# Patient Record
Sex: Male | Born: 1940 | Race: Black or African American | Hispanic: No | Marital: Single | Smoking: Former smoker
Health system: Southern US, Community
[De-identification: ages and names within clinical notes are randomized; demographics above are authoritative.]

## PROBLEM LIST (undated history)

## (undated) DIAGNOSIS — R0789 Other chest pain: Secondary | ICD-10-CM

## (undated) DIAGNOSIS — H409 Unspecified glaucoma: Secondary | ICD-10-CM

## (undated) DIAGNOSIS — K219 Gastro-esophageal reflux disease without esophagitis: Secondary | ICD-10-CM

## (undated) HISTORY — PX: NO PAST SURGERIES: SHX2092

## (undated) HISTORY — DX: Other chest pain: R07.89

## (undated) HISTORY — DX: Gastro-esophageal reflux disease without esophagitis: K21.9

## (undated) HISTORY — DX: Unspecified glaucoma: H40.9

---

## 2001-08-23 ENCOUNTER — Emergency Department (HOSPITAL_COMMUNITY): Admission: EM | Admit: 2001-08-23 | Discharge: 2001-08-23 | Payer: Self-pay | Admitting: Emergency Medicine

## 2001-08-23 ENCOUNTER — Encounter: Payer: Self-pay | Admitting: Emergency Medicine

## 2004-07-17 ENCOUNTER — Ambulatory Visit (HOSPITAL_COMMUNITY): Admission: RE | Admit: 2004-07-17 | Discharge: 2004-07-17 | Payer: Self-pay | Admitting: Gastroenterology

## 2014-05-10 DIAGNOSIS — H52223 Regular astigmatism, bilateral: Secondary | ICD-10-CM | POA: Diagnosis not present

## 2014-05-10 DIAGNOSIS — H524 Presbyopia: Secondary | ICD-10-CM | POA: Diagnosis not present

## 2014-05-10 DIAGNOSIS — H5203 Hypermetropia, bilateral: Secondary | ICD-10-CM | POA: Diagnosis not present

## 2014-05-10 DIAGNOSIS — H40013 Open angle with borderline findings, low risk, bilateral: Secondary | ICD-10-CM | POA: Diagnosis not present

## 2014-05-10 DIAGNOSIS — H2513 Age-related nuclear cataract, bilateral: Secondary | ICD-10-CM | POA: Diagnosis not present

## 2014-06-07 DIAGNOSIS — H40023 Open angle with borderline findings, high risk, bilateral: Secondary | ICD-10-CM | POA: Diagnosis not present

## 2014-06-27 DIAGNOSIS — H4011X1 Primary open-angle glaucoma, mild stage: Secondary | ICD-10-CM | POA: Diagnosis not present

## 2014-08-16 DIAGNOSIS — H4011X1 Primary open-angle glaucoma, mild stage: Secondary | ICD-10-CM | POA: Diagnosis not present

## 2014-10-10 DIAGNOSIS — D120 Benign neoplasm of cecum: Secondary | ICD-10-CM | POA: Diagnosis not present

## 2014-10-10 DIAGNOSIS — Z1211 Encounter for screening for malignant neoplasm of colon: Secondary | ICD-10-CM | POA: Diagnosis not present

## 2014-10-10 DIAGNOSIS — K5730 Diverticulosis of large intestine without perforation or abscess without bleeding: Secondary | ICD-10-CM | POA: Diagnosis not present

## 2014-10-16 DIAGNOSIS — Z0000 Encounter for general adult medical examination without abnormal findings: Secondary | ICD-10-CM | POA: Diagnosis not present

## 2014-10-16 DIAGNOSIS — Z23 Encounter for immunization: Secondary | ICD-10-CM | POA: Diagnosis not present

## 2014-10-16 DIAGNOSIS — L723 Sebaceous cyst: Secondary | ICD-10-CM | POA: Diagnosis not present

## 2014-10-16 DIAGNOSIS — E780 Pure hypercholesterolemia: Secondary | ICD-10-CM | POA: Diagnosis not present

## 2014-10-16 DIAGNOSIS — I868 Varicose veins of other specified sites: Secondary | ICD-10-CM | POA: Diagnosis not present

## 2016-03-03 ENCOUNTER — Encounter (HOSPITAL_COMMUNITY): Payer: Self-pay | Admitting: Emergency Medicine

## 2016-03-03 ENCOUNTER — Emergency Department (HOSPITAL_COMMUNITY)
Admission: EM | Admit: 2016-03-03 | Discharge: 2016-03-03 | Disposition: A | Discharge status: Home / Self Care | Payer: Medicare Other | Attending: Emergency Medicine | Admitting: Emergency Medicine

## 2016-03-03 ENCOUNTER — Emergency Department (HOSPITAL_COMMUNITY): Payer: Medicare Other

## 2016-03-03 DIAGNOSIS — K219 Gastro-esophageal reflux disease without esophagitis: Secondary | ICD-10-CM | POA: Insufficient documentation

## 2016-03-03 DIAGNOSIS — R0789 Other chest pain: Secondary | ICD-10-CM

## 2016-03-03 DIAGNOSIS — R079 Chest pain, unspecified: Secondary | ICD-10-CM | POA: Diagnosis present

## 2016-03-03 DIAGNOSIS — Z79899 Other long term (current) drug therapy: Secondary | ICD-10-CM | POA: Insufficient documentation

## 2016-03-03 LAB — BASIC METABOLIC PANEL
ANION GAP: 6 (ref 5–15)
BUN: 25 mg/dL — AB (ref 6–20)
CALCIUM: 9.6 mg/dL (ref 8.9–10.3)
CO2: 26 mmol/L (ref 22–32)
CREATININE: 1.56 mg/dL — AB (ref 0.61–1.24)
Chloride: 105 mmol/L (ref 101–111)
GFR calc Af Amer: 49 mL/min — ABNORMAL LOW (ref >60)
GFR, EST NON AFRICAN AMERICAN: 42 mL/min — AB (ref >60)
GLUCOSE: 111 mg/dL — AB (ref 65–99)
Potassium: 4.2 mmol/L (ref 3.5–5.1)
Sodium: 137 mmol/L (ref 135–145)

## 2016-03-03 LAB — I-STAT TROPONIN, ED: TROPONIN I, POC: 0 ng/mL (ref 0.00–0.08)

## 2016-03-03 LAB — CBC
HCT: 41.9 % (ref 39.0–52.0)
HEMOGLOBIN: 14.4 g/dL (ref 13.0–17.0)
MCH: 31.9 pg (ref 26.0–34.0)
MCHC: 34.4 g/dL (ref 30.0–36.0)
MCV: 92.9 fL (ref 78.0–100.0)
PLATELETS: 175 10*3/uL (ref 150–400)
RBC: 4.51 MIL/uL (ref 4.22–5.81)
RDW: 13.3 % (ref 11.5–15.5)
WBC: 9.9 10*3/uL (ref 4.0–10.5)

## 2016-03-03 LAB — D-DIMER, QUANTITATIVE: D-Dimer, Quant: 0.55 ug/mL-FEU — ABNORMAL HIGH (ref 0.00–0.50)

## 2016-03-03 MED ORDER — GI COCKTAIL ~~LOC~~
30.0000 mL | Freq: Once | ORAL | Status: AC
  Administered 2016-03-03: 30 mL via ORAL
  Filled 2016-03-03: qty 30

## 2016-03-03 MED ORDER — OMEPRAZOLE 20 MG PO CPDR: 20.0000 mg | DELAYED_RELEASE_CAPSULE | Freq: Every day | ORAL | 0 refills | Status: AC

## 2016-03-03 NOTE — ED Triage Notes (Signed)
Pt reports having chest pain that started at 1900 on 03/02/16 and pt was recently traveling via plane for the last 2 hr. Pt states it is difficult to take a deep breath. Pt stated pain feels like trapped gas.

## 2016-03-03 NOTE — ED Provider Notes (Signed)
[facility]-EMERGENCY DEPT Provider Note   CSN: [id number] Arrival date & time: 03/03/16  0142 By signing my name below, I, Rickey Cunningham, attest that this documentation has been prepared under the direction and in the presence of Shon Batonourtney F Anaia Frith, MD . Electronically Signed: Levon HedgerElizabeth Cunningham, Scribe. 03/03/2016. 2:45 AM.   History   Chief Complaint Chief Complaint  Patient presents with  . Chest Pain  . Shortness of Breath   HPI Rickey Cunningham is a 75 y.o. male with a hx of GERD who presents to the Emergency Department complaining of mild, dull chest pain that radiates to his left arm and back onset last night at 7 pm. He states it feels "like trapped gas"; he rates this pain as 3/10. Pt flew from [place] to [place] today and then [place] [facility] [facility] [place]. Per pt, this pain began tonight after eating pizza at the airport in [place]. Per pt, this feels similar to prior episodes of GERD. Pt states he is currently enrolled in a cardiac study at [facility], but denies any hx of heart disease, DM, HTN, HLD, or blood clots. Pt denies any SOB or new leg swelling.   The history is provided by the patient. No language interpreter was used.   No past medical history on file.  There are no active problems to display for this patient.   No past surgical history on file.   Home Medications    Prior to Admission medications   Medication Sig Start Date End Date Taking? Authorizing Provider  Investigational - Study Medication Take 1 tablet by mouth daily. 02/09/16 05/31/16 Yes Historical Provider, MD  latanoprost (XALATAN) 0.005 % ophthalmic solution 1 drop at bedtime. 01/19/16  Yes Historical Provider, MD  omeprazole (PRILOSEC) 20 MG capsule Take 1 capsule (20 mg total) by mouth daily. 03/03/16   Shon Batonourtney F Greydis Stlouis, MD    Family History No family history on file.  Social History Social History  Substance Use Topics  . Smoking status: Never Smoker  . Smokeless tobacco: Never Used  .  Alcohol use No     Allergies   Patient has no known allergies.   Review of Systems Review of Systems  Constitutional: Negative for fever.  Respiratory: Negative for cough and shortness of breath.   Cardiovascular: Positive for chest pain. Negative for leg swelling.  Gastrointestinal: Negative for abdominal pain, nausea and vomiting.  All other systems reviewed and are negative.  Physical Exam Updated Vital Signs BP 129/68   Pulse 65   Temp 97.8 F (36.6 C) (Oral)   Resp 19   Ht 6\' 1"  (1.854 m)   Wt 209 lb (94.8 kg)   SpO2 100%   BMI 27.57 kg/m   Physical Exam  Constitutional: He is oriented to person, place, and time. He appears well-developed and well-nourished.  Appears younger than stated age  HENT:  Head: Normocephalic and atraumatic.  Cardiovascular: Normal rate, regular rhythm and normal heart sounds.   No murmur heard. Pulmonary/Chest: Effort normal and breath sounds normal. No respiratory distress. He has no wheezes.  Abdominal: Soft. Bowel sounds are normal. There is no tenderness. There is no rebound.  Musculoskeletal: He exhibits no edema.  Neurological: He is alert and oriented to person, place, and time.  Skin: Skin is warm and dry.  Psychiatric: He has a normal mood and affect.  Nursing note and vitals reviewed.   ED Treatments / Results  DIAGNOSTIC STUDIES:  Oxygen Saturation is 97% on RA, nl by my interpretation.  COORDINATION OF CARE:  2:38 AM Discussed treatment plan with pt at bedside and pt agreed to plan.  Labs (all labs ordered are listed, but only abnormal results are displayed) Labs Reviewed  BASIC METABOLIC PANEL - Abnormal; Notable for the following:       Result Value   Glucose, Bld 111 (*)    BUN 25 (*)    Creatinine, Ser 1.56 (*)    GFR calc non Af Amer 42 (*)    GFR calc Af Amer 49 (*)    All other components within normal limits  D-DIMER, QUANTITATIVE (NOT AT [facility]) - Abnormal; Notable for the following:    D-Dimer,  Quant 0.55 (*)    All other components within normal limits  CBC  I-STAT TROPOININ, ED    EKG  EKG Interpretation  Date/Time:  Monday March 03 2016 01:46:30 EST Ventricular Rate:  64 PR Interval:    QRS Duration: 90 QT Interval:  387 QTC Calculation: 400 R Axis:   28 Text Interpretation:  Sinus rhythm Consider left atrial enlargement Confirmed by Wilkie AyeHORTON  MD, Josia Cueva ([id number]) on 03/03/2016 4:06:53 AM       Radiology Dg Chest 2 View  Result Date: 03/03/2016 CLINICAL DATA:  Chest pain starting at 1700 hours on 02 March 2016. EXAM: CHEST  2 VIEW COMPARISON:  Report from 08/23/2001 of the right ribs and shoulder. FINDINGS: The heart size and mediastinal contours are within normal limits. Both lungs are clear. No acute osseous abnormality. IMPRESSION: No active cardiopulmonary disease. Electronically Signed   By: Tollie Ethavid  Kwon M.D.   On: 03/03/2016 02:49    Procedures Procedures (including critical care time)  Medications Ordered in ED Medications  gi cocktail (Maalox,Lidocaine,Donnatal) (30 mLs Oral Given 03/03/16 0311)     Initial Impression / Assessment and Plan / ED Course  I have reviewed the triage vital signs and the nursing notes.  Pertinent labs & imaging results that were available during my care of the patient were reviewed by me and considered in my medical decision making (see chart for details).  Clinical Course     Patient presents with chest pain. Atypical for ACS. Heart score of 2. EKG is nonischemic and initial troponin is negative. Reports the pain started with eating and is similar to prior reflux symptoms. Pain improved with a GI cocktail. Given extensive travel, d-dimer was sent. Per age adjusted cutoffs, this is negative. On recheck, patient reports that he feels much improved. He is no longer in pain. Low suspicion for ACS at this time. We'll refer to cardiology. He also has follow-up for the cardiology study he is participating in.  After history,  exam, and medical workup I feel the patient has been appropriately medically screened and is safe for discharge home. Pertinent diagnoses were discussed with the patient. Patient was given return precautions.   Final Clinical Impressions(s) / ED Diagnoses   Final diagnoses:  Atypical chest pain  Gastroesophageal reflux disease, esophagitis presence not specified    New Prescriptions New Prescriptions   OMEPRAZOLE (PRILOSEC) 20 MG CAPSULE    Take 1 capsule (20 mg total) by mouth daily.   I personally performed the services described in this documentation, which was scribed in my presence. The recorded information has been reviewed and is accurate.    Shon Batonourtney F Abagail Limb, MD 03/03/16 [phone]

## 2016-03-03 NOTE — ED Notes (Signed)
Patient transported to X-ray 

## 2016-03-03 NOTE — Discharge Instructions (Signed)
If you have worsening CP, SOB, or any new or worsening symptoms, you need to be re-evaluated.

## 2016-03-13 ENCOUNTER — Encounter: Payer: Self-pay | Admitting: *Deleted

## 2016-04-04 ENCOUNTER — Encounter (INDEPENDENT_AMBULATORY_CARE_PROVIDER_SITE_OTHER): Payer: Self-pay

## 2016-04-04 ENCOUNTER — Ambulatory Visit (INDEPENDENT_AMBULATORY_CARE_PROVIDER_SITE_OTHER): Payer: Medicare Other | Admitting: Internal Medicine

## 2016-04-04 ENCOUNTER — Encounter: Payer: Self-pay | Admitting: Internal Medicine

## 2016-04-04 VITALS — BP 142/68 | HR 66 | Ht 73.0 in | Wt 212.2 lb

## 2016-04-04 DIAGNOSIS — R079 Chest pain, unspecified: Secondary | ICD-10-CM | POA: Diagnosis not present

## 2016-04-04 DIAGNOSIS — N289 Disorder of kidney and ureter, unspecified: Secondary | ICD-10-CM | POA: Diagnosis not present

## 2016-04-04 DIAGNOSIS — R030 Elevated blood-pressure reading, without diagnosis of hypertension: Secondary | ICD-10-CM

## 2016-04-04 NOTE — Progress Notes (Signed)
New Outpatient Visit Date: 04/04/2016  Primary Care Provider: L.Lupe Carney, MD [address] [place], [place] [address]  Chief Complaint: Chest pain  HPI:  Rickey Cunningham is a 76 y.o. year-old male with history of GERD and glaucoma, who has been referred by Dr. Ross Marcus of the [facility] ED for evaluation of chest pain. The patient presented to the ED on 03/03/16 with chest pain radiating to the left arm and back. ED workup was notable for normal EKG, negative troponins 1, and borderline elevated d-dimer. The reports that his chest pain began shortly after eating a piece of pizza at [place] airport. He had a sharp pain extending from the sternum under his left breast without radiation that lasted 2-3 days. The pain was 7-8/10 in intensity without accompanying symptoms such as dyspnea, nausea, diaphoresis, and lightheadedness. He was given an antacid after evaluation in the emergency department with resolution of the pain. He has not had recurrence since. He is typically quite active and exercises regularly, predominantly doing strength training exercises. He does not have any exertional symptoms. He denies a history of prior cardiovascular disease. His only previous cardiac workup has been an EKG.  Rickey Cunningham denies palpitations and lightheadedness. He notes occasional leg swelling when seated for extended periods of time, though this resolves with leg elevation and ambulation. He had his annual physical with Dr. Clovis Riley about 6 months ago and was told that everything looked fine. The patient denies a history of hypertension and kidney disease. He believes lipids were checked at the time of his last physical. He is also enrolled N/A cardiovascular health study through [facility].  --------------------------------------------------------------------------------------------------  Cardiovascular History & Procedures: Cardiovascular Problems:  Chest pain  Risk  Factors:  Male gender and age greater than 64  Cath/PCI:  None  CV Surgery:  None  EP Procedures and Devices:  None  Non-Invasive Evaluation(s):  None  Recent CV Pertinent Labs: Lab Results  Component Value Date   K 4.2 03/03/2016   BUN 25 (H) 03/03/2016   CREATININE 1.56 (H) 03/03/2016    --------------------------------------------------------------------------------------------------  Past Medical History:  Diagnosis Date  . Atypical chest pain   . GERD (gastroesophageal reflux disease)   . GERD (gastroesophageal reflux disease)   . Glaucoma     Past Surgical History:  Procedure Laterality Date  . NO PAST SURGERIES      Outpatient Encounter Prescriptions as of 04/04/2016  Medication Sig  . Investigational - Study Medication Take 1 tablet by mouth daily.  Marland Kitchen latanoprost (XALATAN) 0.005 % ophthalmic solution Place 1 drop into both eyes at bedtime.   Marland Kitchen omeprazole (PRILOSEC) 20 MG capsule Take 1 capsule (20 mg total) by mouth daily.   No facility-administered encounter medications on file as of 04/04/2016.     Allergies: Patient has no known allergies.  Social History   Social History  . Marital status: Single    Spouse name: N/A  . Number of children: N/A  . Years of education: N/A   Occupational History  . Not on file.   Social History Main Topics  . Smoking status: Former Smoker    Packs/day: 0.10    Years: 10.00    Quit date: 21  . Smokeless tobacco: Never Used  . Alcohol use 0.6 oz/week    1 Glasses of wine per week  . Drug use: No  . Sexual activity: Not on file   Other Topics Concern  . Not on file   Social  History Narrative  . No narrative on file    Family History  Problem Relation Age of Onset  . Diabetes Mother   . Kidney disease Father   . Diabetes Father   . Diabetes Brother   . Diabetes Brother     Review of Systems: A 12-system review of systems was performed and was negative except as noted in the  HPI.  --------------------------------------------------------------------------------------------------  Physical Exam: BP (!) 160/68   Pulse 66   Ht 6\' 1"  (1.854 m)   Wt 212 lb 3.2 oz (96.3 kg)   BMI 28.00 kg/m  Repeat BP: 142/68  General:  Well-developed, well-nourished man, seated comfortably in the exam room. HEENT: No conjunctival pallor or scleral icterus.  Moist mucous membranes.  OP clear. Neck: Supple without lymphadenopathy, thyromegaly, JVD, or HJR.  No carotid bruit. Lungs: Normal work of breathing.  Clear to auscultation bilaterally without wheezes or crackles. Heart: Regular rate and rhythm without murmurs, rubs, or gallops.  Non-displaced PMI. Abd: Bowel sounds present.  Soft, NT/ND without hepatosplenomegaly Ext: No lower extremity edema.  Radial, PT, and DP pulses are 2+ bilaterally Skin: warm and dry without rash Neuro: CNIII-XII intact.  Strength and fine-touch sensation intact in upper and lower extremities bilaterally. Psych: Normal mood and affect.  EKG:  Sinus bradycardia (rate 56 bpm) without significant abnormalities.  Heart rate has decreased slightly since prior tracing on 03/03/16.  Otherwise, there has been no significant interval change (I have personally reviewed both tracings).  Lab Results  Component Value Date   WBC 9.9 03/03/2016   HGB 14.4 03/03/2016   HCT 41.9 03/03/2016   MCV 92.9 03/03/2016   PLT 175 03/03/2016    Lab Results  Component Value Date   NA 137 03/03/2016   K 4.2 03/03/2016   CL 105 03/03/2016   CO2 26 03/03/2016   BUN 25 (H) 03/03/2016   CREATININE 1.56 (H) 03/03/2016   GLUCOSE 111 (H) 03/03/2016    --------------------------------------------------------------------------------------------------  ASSESSMENT AND PLAN: Noncardiac chest pain The patient's description of pain is consistent with noncardiac chest pain. He is typically quite active and has not had any exertional symptoms. Given that the discomfort began  after eating a piece of pizza and lasted several days, ultimately resolving with an antacid, I favor a GI etiology. He currently takes omeprazole only as needed. I recommended that he begin taking a PPI regularly if he has recurrence of the pain. If he develops new chest pain or shortness of breath, he should contact us for further evaluation. I will defer ongoing primary prevention to Dr. Clovis RileyMitchell.  Renal insufficiency Labs during recent ED visit were notable for creatinine of 1.6. There is no prior value for comparison. The patient denies a history of kidney disease. We will recheck his creatinine today. If it remains elevated, the patient will need to follow with his PCP for further evaluation.  Elevated blood pressure Blood pressure mildly to moderately elevated today. Patient notes that he has had some elevated readings in the past, though these are typically normal on recheck. We discussed lifestyle modifications, including cardiovascular exercise and avoidance of salt. The patient should follow-up with his PCP as previously directed.  Follow-up: Return to clinic as needed.  Yvonne Kendallhristopher Glennis Borger, MD 04/04/2016 2:11 PM

## 2016-04-04 NOTE — Patient Instructions (Signed)
Medication Instructions:  Your physician recommends that you continue on your current medications as directed. Please refer to the Current Medication list given to you today.   Labwork: BMET today  Testing/Procedures: None   Follow-Up: Your physician recommends that you schedule a follow-up appointment as needed with Dr End.         If you need a refill on your cardiac medications before your next appointment, please call your pharmacy.

## 2016-04-05 LAB — BASIC METABOLIC PANEL
BUN/Creatinine Ratio: 17 (ref 10–24)
BUN: 26 mg/dL (ref 8–27)
CALCIUM: 9.3 mg/dL (ref 8.6–10.2)
CHLORIDE: 98 mmol/L (ref 96–106)
CO2: 24 mmol/L (ref 18–29)
CREATININE: 1.49 mg/dL — AB (ref 0.76–1.27)
GFR calc non Af Amer: 46 mL/min/{1.73_m2} — ABNORMAL LOW (ref >59)
GFR, EST AFRICAN AMERICAN: 53 mL/min/{1.73_m2} — AB (ref >59)
Glucose: 103 mg/dL — ABNORMAL HIGH (ref 65–99)
Potassium: 4.1 mmol/L (ref 3.5–5.2)
Sodium: 140 mmol/L (ref 134–144)

## 2016-04-11 ENCOUNTER — Encounter: Payer: Self-pay | Admitting: *Deleted

## 2016-04-17 ENCOUNTER — Telehealth: Payer: Self-pay | Admitting: Internal Medicine

## 2016-04-17 NOTE — Telephone Encounter (Signed)
I spoke with patient about lab done 04/04/16.

## 2016-04-17 NOTE — Telephone Encounter (Signed)
Mr. Rickey Cunningham is calling to get his test results . Please call   Thanks

## 2018-10-24 IMAGING — CR DG CHEST 2V
2 series · 2 of 2 positions shown · non-contrast
Comparison: Report from 08/23/2001 of the right ribs and shoulder.

CLINICAL DATA: Chest pain starting at 5055 hours on 02 March, 2016.

EXAM:
CHEST  2 VIEW

[w chest pa]
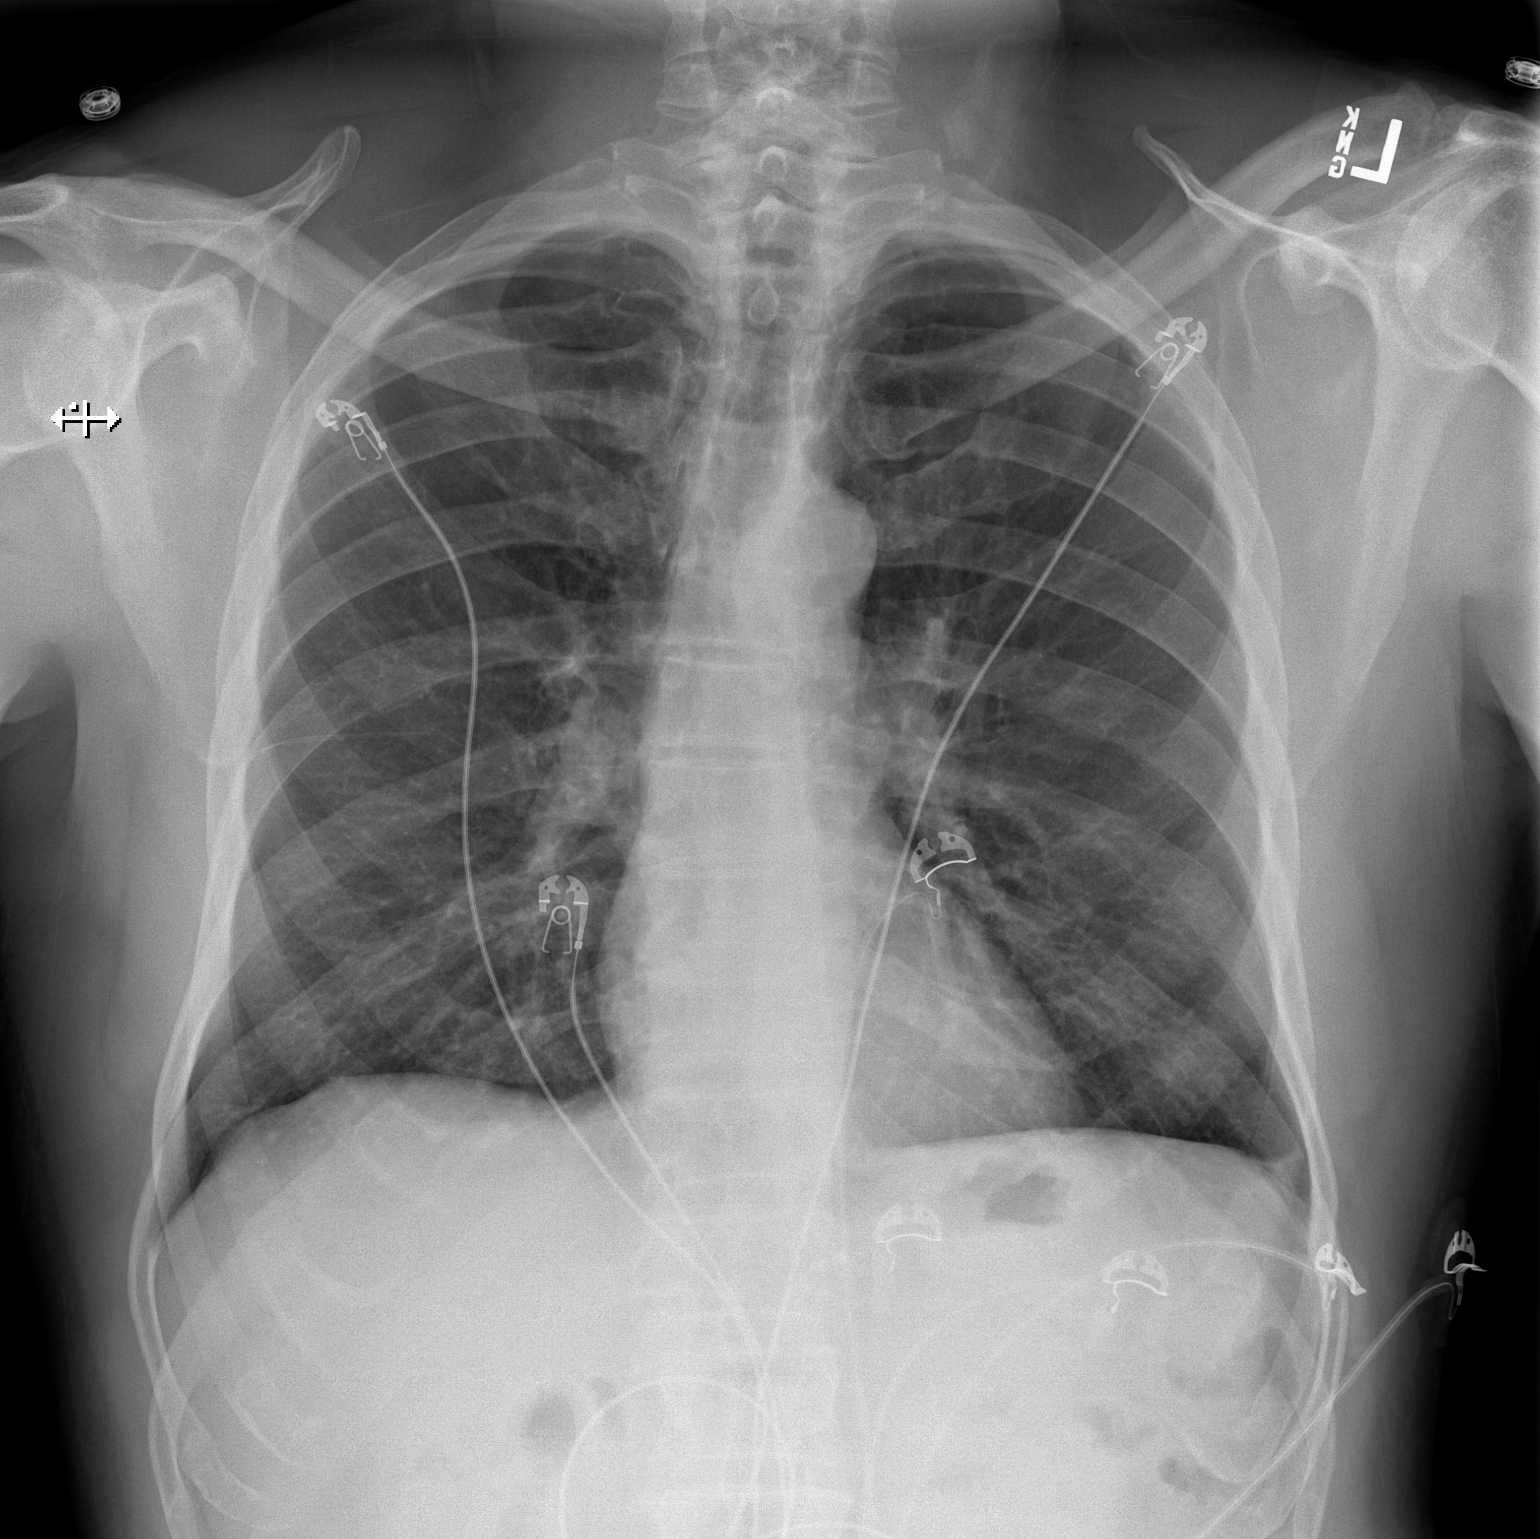

[w chest lat]
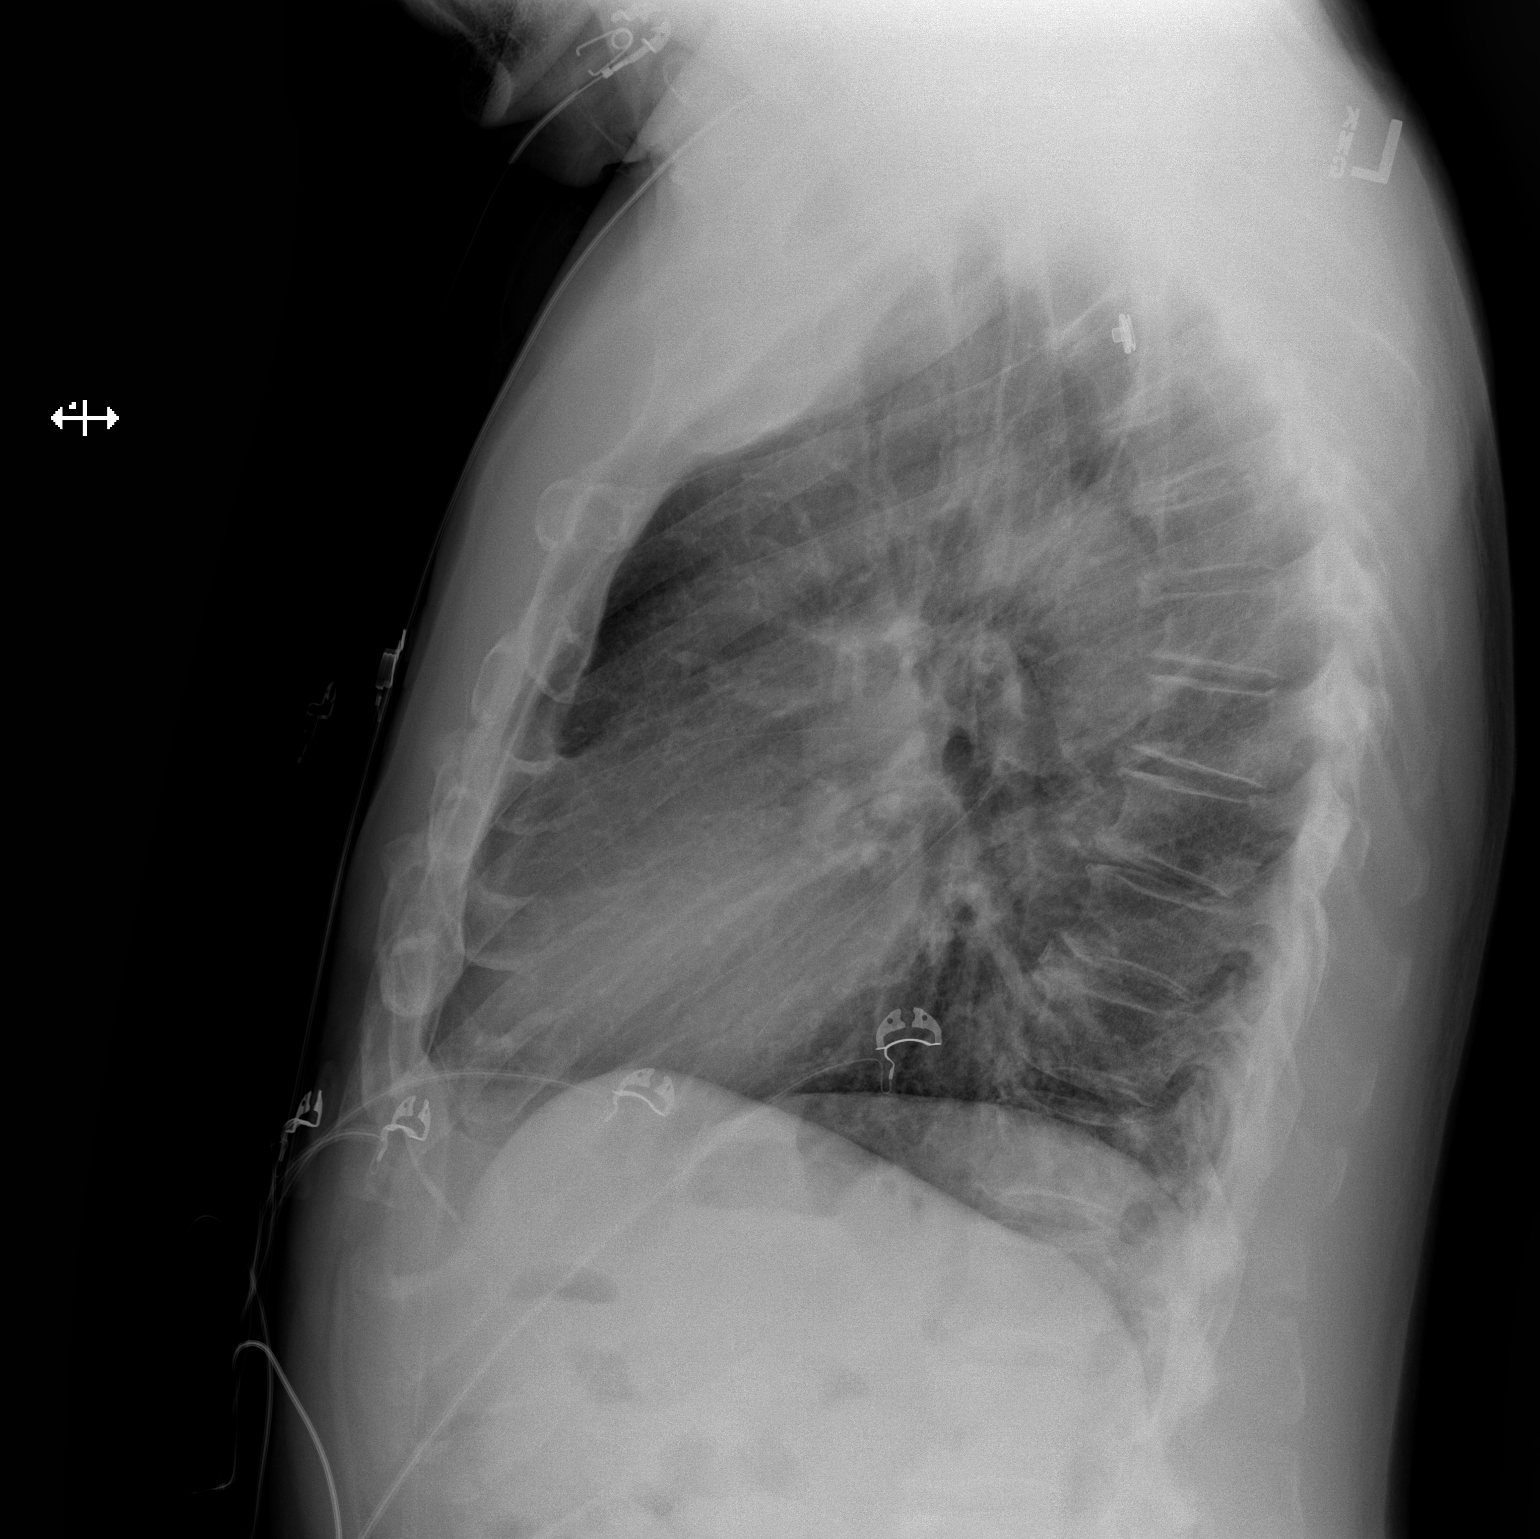

[2 of 2 positions shown; findings below may reference images not displayed]

FINDINGS: The heart size and mediastinal contours are within normal limits.
Both lungs are clear. No acute osseous abnormality.
IMPRESSION: No active cardiopulmonary disease.

## 2020-02-23 DIAGNOSIS — H401111 Primary open-angle glaucoma, right eye, mild stage: Secondary | ICD-10-CM | POA: Diagnosis not present

## 2020-03-30 DIAGNOSIS — Z1152 Encounter for screening for COVID-19: Secondary | ICD-10-CM | POA: Diagnosis not present

## 2020-04-04 DIAGNOSIS — K621 Rectal polyp: Secondary | ICD-10-CM | POA: Diagnosis not present

## 2020-04-04 DIAGNOSIS — Z86010 Personal history of colonic polyps: Secondary | ICD-10-CM | POA: Diagnosis not present

## 2020-04-06 DIAGNOSIS — K621 Rectal polyp: Secondary | ICD-10-CM | POA: Diagnosis not present

## 2020-12-11 DIAGNOSIS — N183 Chronic kidney disease, stage 3 unspecified: Secondary | ICD-10-CM | POA: Diagnosis not present

## 2020-12-11 DIAGNOSIS — M25512 Pain in left shoulder: Secondary | ICD-10-CM | POA: Diagnosis not present

## 2020-12-11 DIAGNOSIS — Z0000 Encounter for general adult medical examination without abnormal findings: Secondary | ICD-10-CM | POA: Diagnosis not present

## 2020-12-11 DIAGNOSIS — E7800 Pure hypercholesterolemia, unspecified: Secondary | ICD-10-CM | POA: Diagnosis not present

## 2020-12-11 DIAGNOSIS — Z23 Encounter for immunization: Secondary | ICD-10-CM | POA: Diagnosis not present

## 2020-12-11 DIAGNOSIS — I868 Varicose veins of other specified sites: Secondary | ICD-10-CM | POA: Diagnosis not present

## 2021-03-04 DIAGNOSIS — H16223 Keratoconjunctivitis sicca, not specified as Sjogren's, bilateral: Secondary | ICD-10-CM | POA: Diagnosis not present

## 2021-03-04 DIAGNOSIS — H40002 Preglaucoma, unspecified, left eye: Secondary | ICD-10-CM | POA: Diagnosis not present

## 2021-03-04 DIAGNOSIS — H401111 Primary open-angle glaucoma, right eye, mild stage: Secondary | ICD-10-CM | POA: Diagnosis not present

## 2021-05-10 DIAGNOSIS — M19012 Primary osteoarthritis, left shoulder: Secondary | ICD-10-CM | POA: Diagnosis not present

## 2021-06-24 DIAGNOSIS — M19012 Primary osteoarthritis, left shoulder: Secondary | ICD-10-CM | POA: Diagnosis not present

## 2021-08-20 DIAGNOSIS — K090 Developmental odontogenic cysts: Secondary | ICD-10-CM | POA: Diagnosis not present

## 2021-08-27 DIAGNOSIS — L723 Sebaceous cyst: Secondary | ICD-10-CM | POA: Diagnosis not present

## 2021-10-10 DIAGNOSIS — L723 Sebaceous cyst: Secondary | ICD-10-CM | POA: Diagnosis not present

## 2021-11-11 DIAGNOSIS — L723 Sebaceous cyst: Secondary | ICD-10-CM | POA: Diagnosis not present

## 2021-11-11 DIAGNOSIS — L089 Local infection of the skin and subcutaneous tissue, unspecified: Secondary | ICD-10-CM | POA: Diagnosis not present

## 2021-11-12 ENCOUNTER — Ambulatory Visit: Payer: Self-pay | Admitting: Surgery

## 2021-11-12 NOTE — H&P (Signed)
Subjective    Chief Complaint: New Consultation (Cyst on back )       History of Present Illness: Rickey Cunningham is a 81 y.o. male who is seen today as an office consultation at the request of Dr. Lewie Chamber for evaluation of New Consultation (Cyst on back ) .     This is a 81 year old male in excellent health who exercises vigorously.  He had a small cyst on the middle of his back for some time.  However in June of this year, it became larger and inflamed.  He underwent incision and drainage by his primary care physician.  The wound has never healed completely and continues to have small amounts of drainage.  The patient is referred to [place] to discuss formal excision of the cyst to allow complete wound healing.     Review of Systems: A complete review of systems was obtained from the patient.  I have reviewed this information and discussed as appropriate with the patient.  See HPI as well for other ROS.   Review of Systems  Constitutional: Negative.   HENT: Negative.    Eyes: Negative.   Respiratory: Negative.    Cardiovascular: Negative.   Gastrointestinal: Negative.   Genitourinary: Negative.   Musculoskeletal: Negative.   Skin: Negative.   Neurological: Negative.   Endo/Heme/Allergies: Negative.   Psychiatric/Behavioral: Negative.         Medical History: Past Medical History      Past Medical History:  Diagnosis Date   GERD (gastroesophageal reflux disease)             Patient Active Problem List  Diagnosis   Chronic kidney disease, stage 3 unspecified (CMS-HCC)   Gastroesophageal reflux disease   Pure hypercholesterolemia   Sebaceous cyst      Past Surgical History  History reviewed. No pertinent surgical history.      Allergies  No Known Allergies     No current outpatient medications on file prior to visit.    No current facility-administered medications on file prior to visit.      Family History       Family History  Problem Relation Age of Onset    High blood pressure (Hypertension) Mother     Breast cancer Brother          Social History        Tobacco Use  Smoking Status Former   Types: Cigarettes  Smokeless Tobacco Not on file      Social History  Social History         Socioeconomic History   Marital status: Divorced  Tobacco Use   Smoking status: Former      Types: Cigarettes  Substance and Sexual Activity   Alcohol use: Not Currently   Drug use: Never        Objective:         Vitals:    11/11/21 0938  BP: 114/66  Pulse: 54  Temp: 36.4 C (97.6 F)  SpO2: 96%  Weight: 87.7 kg (193 lb 6.4 oz)  Height: 185.4 cm (6\' 1" )    Body mass index is 25.52 kg/m.   Physical Exam    Constitutional:  WDWN in NAD, conversant, no obvious deformities; lying in bed comfortably Eyes:  Pupils equal, round; sclera anicteric; moist conjunctiva; no lid lag HENT:  Oral mucosa moist; good dentition  Neck:  No masses palpated, trachea midline; no thyromegaly Lungs:  CTA bilaterally; normal respiratory effort CV:  Regular rate and rhythm;  no murmurs; extremities well-perfused with no edema Abd:  +bowel sounds, soft, non-tender, no palpable organomegaly; no palpable hernias Musc:  Unable to assess gait; no apparent clubbing or cyanosis in extremities Lymphatic:  No palpable cervical or axillary lymphadenopathy Skin:  Warm, dry; in the center of his back just to the right of the spine there is a 2 cm area of chronic discoloration of the skin.  In the center of this, there is a pinpoint opening with some whitish drainage.  No cellulitis. Psychiatric - alert and oriented x 4; calm mood and affect       Assessment and Plan:  Diagnoses and all orders for this visit:   Infected sebaceous cyst of skin       Chronic sebaceous cyst requires formal excision to completely remove the wall to allow proper healing.   Recommend excision of sebaceous cyst of the back.The surgical procedure has been discussed with the patient.   Potential risks, benefits, alternative treatments, and expected outcomes have been explained.  All of the patient's questions at this time have been answered.  The likelihood of reaching the patient's treatment goal is good.  The patient understand the proposed surgical procedure and wishes to proceed.         Kamylle Axelson Delbert Harness, MD  11/12/2021 9:52 AM

## 2021-11-27 DIAGNOSIS — L723 Sebaceous cyst: Secondary | ICD-10-CM | POA: Diagnosis not present

## 2021-11-27 DIAGNOSIS — L720 Epidermal cyst: Secondary | ICD-10-CM | POA: Diagnosis not present

## 2021-12-23 DIAGNOSIS — R609 Edema, unspecified: Secondary | ICD-10-CM | POA: Diagnosis not present

## 2021-12-23 DIAGNOSIS — Z23 Encounter for immunization: Secondary | ICD-10-CM | POA: Diagnosis not present

## 2021-12-23 DIAGNOSIS — E7800 Pure hypercholesterolemia, unspecified: Secondary | ICD-10-CM | POA: Diagnosis not present

## 2021-12-23 DIAGNOSIS — N183 Chronic kidney disease, stage 3 unspecified: Secondary | ICD-10-CM | POA: Diagnosis not present

## 2021-12-23 DIAGNOSIS — Z0000 Encounter for general adult medical examination without abnormal findings: Secondary | ICD-10-CM | POA: Diagnosis not present

## 2021-12-23 DIAGNOSIS — I868 Varicose veins of other specified sites: Secondary | ICD-10-CM | POA: Diagnosis not present

## 2022-03-12 DIAGNOSIS — R399 Unspecified symptoms and signs involving the genitourinary system: Secondary | ICD-10-CM | POA: Diagnosis not present

## 2022-04-02 DIAGNOSIS — N390 Urinary tract infection, site not specified: Secondary | ICD-10-CM | POA: Diagnosis not present

## 2022-06-18 DIAGNOSIS — M25512 Pain in left shoulder: Secondary | ICD-10-CM | POA: Diagnosis not present

## 2022-06-23 DIAGNOSIS — M25512 Pain in left shoulder: Secondary | ICD-10-CM | POA: Diagnosis not present

## 2022-10-27 DIAGNOSIS — M25512 Pain in left shoulder: Secondary | ICD-10-CM | POA: Diagnosis not present

## 2022-11-17 DIAGNOSIS — M19012 Primary osteoarthritis, left shoulder: Secondary | ICD-10-CM | POA: Diagnosis not present

## 2022-12-26 DIAGNOSIS — E7800 Pure hypercholesterolemia, unspecified: Secondary | ICD-10-CM | POA: Diagnosis not present

## 2022-12-26 DIAGNOSIS — Z23 Encounter for immunization: Secondary | ICD-10-CM | POA: Diagnosis not present

## 2022-12-26 DIAGNOSIS — Z0000 Encounter for general adult medical examination without abnormal findings: Secondary | ICD-10-CM | POA: Diagnosis not present

## 2022-12-26 DIAGNOSIS — N183 Chronic kidney disease, stage 3 unspecified: Secondary | ICD-10-CM | POA: Diagnosis not present

## 2022-12-26 DIAGNOSIS — K219 Gastro-esophageal reflux disease without esophagitis: Secondary | ICD-10-CM | POA: Diagnosis not present

## 2023-02-16 DIAGNOSIS — M25512 Pain in left shoulder: Secondary | ICD-10-CM | POA: Diagnosis not present

## 2023-02-20 DIAGNOSIS — M25512 Pain in left shoulder: Secondary | ICD-10-CM | POA: Diagnosis not present

## 2023-03-06 DIAGNOSIS — M25512 Pain in left shoulder: Secondary | ICD-10-CM | POA: Diagnosis not present

## 2023-03-19 DIAGNOSIS — M7542 Impingement syndrome of left shoulder: Secondary | ICD-10-CM | POA: Diagnosis not present

## 2023-03-19 DIAGNOSIS — G8918 Other acute postprocedural pain: Secondary | ICD-10-CM | POA: Diagnosis not present

## 2023-03-19 DIAGNOSIS — M7522 Bicipital tendinitis, left shoulder: Secondary | ICD-10-CM | POA: Diagnosis not present

## 2023-03-19 DIAGNOSIS — S46012A Strain of muscle(s) and tendon(s) of the rotator cuff of left shoulder, initial encounter: Secondary | ICD-10-CM | POA: Diagnosis not present

## 2023-03-19 DIAGNOSIS — M19012 Primary osteoarthritis, left shoulder: Secondary | ICD-10-CM | POA: Diagnosis not present

## 2023-03-19 DIAGNOSIS — S46812A Strain of other muscles, fascia and tendons at shoulder and upper arm level, left arm, initial encounter: Secondary | ICD-10-CM | POA: Diagnosis not present

## 2023-03-19 DIAGNOSIS — M24112 Other articular cartilage disorders, left shoulder: Secondary | ICD-10-CM | POA: Diagnosis not present

## 2023-03-19 DIAGNOSIS — M24012 Loose body in left shoulder: Secondary | ICD-10-CM | POA: Diagnosis not present
